# Patient Record
Sex: Female | Born: 1965 | Race: White | Hispanic: No | Marital: Married | Smoking: Current every day smoker
Health system: Southern US, Community
[De-identification: ages and names within clinical notes are randomized; demographics above are authoritative.]

## PROBLEM LIST (undated history)

## (undated) DIAGNOSIS — R87629 Unspecified abnormal cytological findings in specimens from vagina: Secondary | ICD-10-CM

## (undated) HISTORY — PX: OOPHORECTOMY: SHX86

## (undated) HISTORY — DX: Unspecified abnormal cytological findings in specimens from vagina: R87.629

## (undated) HISTORY — PX: BREAST SURGERY: SHX581

## (undated) HISTORY — PX: TUBAL LIGATION: SHX77

---

## 2015-06-28 LAB — HEPATIC FUNCTION PANEL
ALK PHOS: 49 U/L (ref 25–125)
ALT: 18 U/L (ref 7–35)
AST: 10 U/L — AB (ref 13–35)
BILIRUBIN, TOTAL: 0.6 mg/dL

## 2015-06-28 LAB — CBC AND DIFFERENTIAL
HEMATOCRIT: 43 % (ref 36–46)
HEMOGLOBIN: 14.7 g/dL (ref 12.0–16.0)
Neutrophils Absolute: 4 /uL
Platelets: 241 10*3/uL (ref 150–399)
WBC: 6.1 10^3/mL

## 2015-07-23 LAB — HM PAP SMEAR: HM PAP: NEGATIVE

## 2016-07-31 ENCOUNTER — Other Ambulatory Visit (HOSPITAL_COMMUNITY): Payer: Self-pay | Admitting: Obstetrics & Gynecology

## 2016-07-31 ENCOUNTER — Other Ambulatory Visit: Payer: Self-pay | Admitting: Obstetrics & Gynecology

## 2016-07-31 DIAGNOSIS — Z1239 Encounter for other screening for malignant neoplasm of breast: Secondary | ICD-10-CM

## 2016-08-14 ENCOUNTER — Ambulatory Visit (INDEPENDENT_AMBULATORY_CARE_PROVIDER_SITE_OTHER): Payer: BLUE CROSS/BLUE SHIELD | Admitting: Obstetrics & Gynecology

## 2016-08-14 ENCOUNTER — Encounter: Payer: Self-pay | Admitting: Obstetrics & Gynecology

## 2016-08-14 ENCOUNTER — Encounter: Payer: Self-pay | Admitting: Physician Assistant

## 2016-08-14 ENCOUNTER — Ambulatory Visit (INDEPENDENT_AMBULATORY_CARE_PROVIDER_SITE_OTHER): Payer: BLUE CROSS/BLUE SHIELD | Admitting: Physician Assistant

## 2016-08-14 VITALS — BP 119/82 | HR 96 | Ht 66.0 in | Wt 158.0 lb

## 2016-08-14 VITALS — BP 113/83 | HR 90 | Ht 66.0 in | Wt 157.0 lb

## 2016-08-14 DIAGNOSIS — R002 Palpitations: Secondary | ICD-10-CM | POA: Insufficient documentation

## 2016-08-14 DIAGNOSIS — Z01419 Encounter for gynecological examination (general) (routine) without abnormal findings: Secondary | ICD-10-CM | POA: Diagnosis not present

## 2016-08-14 DIAGNOSIS — Z13220 Encounter for screening for lipoid disorders: Secondary | ICD-10-CM

## 2016-08-14 DIAGNOSIS — Z1151 Encounter for screening for human papillomavirus (HPV): Secondary | ICD-10-CM | POA: Diagnosis not present

## 2016-08-14 DIAGNOSIS — Z124 Encounter for screening for malignant neoplasm of cervix: Secondary | ICD-10-CM

## 2016-08-14 DIAGNOSIS — M549 Dorsalgia, unspecified: Secondary | ICD-10-CM | POA: Diagnosis not present

## 2016-08-14 DIAGNOSIS — R635 Abnormal weight gain: Secondary | ICD-10-CM | POA: Diagnosis not present

## 2016-08-14 DIAGNOSIS — F4323 Adjustment disorder with mixed anxiety and depressed mood: Secondary | ICD-10-CM | POA: Insufficient documentation

## 2016-08-14 DIAGNOSIS — D171 Benign lipomatous neoplasm of skin and subcutaneous tissue of trunk: Secondary | ICD-10-CM | POA: Insufficient documentation

## 2016-08-14 DIAGNOSIS — Z720 Tobacco use: Secondary | ICD-10-CM

## 2016-08-14 DIAGNOSIS — K2100 Gastro-esophageal reflux disease with esophagitis, without bleeding: Secondary | ICD-10-CM | POA: Insufficient documentation

## 2016-08-14 DIAGNOSIS — Z0000 Encounter for general adult medical examination without abnormal findings: Secondary | ICD-10-CM | POA: Diagnosis not present

## 2016-08-14 DIAGNOSIS — Z131 Encounter for screening for diabetes mellitus: Secondary | ICD-10-CM | POA: Diagnosis not present

## 2016-08-14 DIAGNOSIS — N950 Postmenopausal bleeding: Secondary | ICD-10-CM

## 2016-08-14 DIAGNOSIS — L908 Other atrophic disorders of skin: Secondary | ICD-10-CM | POA: Insufficient documentation

## 2016-08-14 DIAGNOSIS — R1310 Dysphagia, unspecified: Secondary | ICD-10-CM | POA: Diagnosis not present

## 2016-08-14 DIAGNOSIS — Z716 Tobacco abuse counseling: Secondary | ICD-10-CM | POA: Diagnosis not present

## 2016-08-14 DIAGNOSIS — R413 Other amnesia: Secondary | ICD-10-CM

## 2016-08-14 LAB — CBC WITH DIFFERENTIAL/PLATELET
BASOS ABS: 63 {cells}/uL (ref 0–200)
BASOS PCT: 1 %
EOS ABS: 126 {cells}/uL (ref 15–500)
Eosinophils Relative: 2 %
HEMATOCRIT: 46.4 % — AB (ref 35.0–45.0)
HEMOGLOBIN: 15.3 g/dL (ref 11.7–15.5)
LYMPHS ABS: 1386 {cells}/uL (ref 850–3900)
Lymphocytes Relative: 22 %
MCH: 29.4 pg (ref 27.0–33.0)
MCHC: 33 g/dL (ref 32.0–36.0)
MCV: 89.1 fL (ref 80.0–100.0)
MONO ABS: 567 {cells}/uL (ref 200–950)
MPV: 10.2 fL (ref 7.5–12.5)
Monocytes Relative: 9 %
NEUTROS ABS: 4158 {cells}/uL (ref 1500–7800)
Neutrophils Relative %: 66 %
Platelets: 246 10*3/uL (ref 140–400)
RBC: 5.21 MIL/uL — ABNORMAL HIGH (ref 3.80–5.10)
RDW: 13.3 % (ref 11.0–15.0)
WBC: 6.3 10*3/uL (ref 3.8–10.8)

## 2016-08-14 LAB — TSH: TSH: 0.85 mIU/L

## 2016-08-14 MED ORDER — SERTRALINE HCL 25 MG PO TABS
25.0000 mg | ORAL_TABLET | Freq: Every day | ORAL | 2 refills | Status: AC
Start: 2016-08-14 — End: ?

## 2016-08-14 MED ORDER — ESTRADIOL 1 MG PO TABS: 1.0000 mg | ORAL_TABLET | Freq: Every day | ORAL | 12 refills | Status: DC

## 2016-08-14 MED ORDER — MEDROXYPROGESTERONE ACETATE 2.5 MG PO TABS: 2.5000 mg | ORAL_TABLET | Freq: Every day | ORAL | 12 refills | Status: DC

## 2016-08-14 MED ORDER — VARENICLINE TARTRATE 0.5 MG X 11 & 1 MG X 42 PO MISC: ORAL | 0 refills | Status: AC

## 2016-08-14 MED ORDER — OMEPRAZOLE 40 MG PO CPDR: 40.0000 mg | DELAYED_RELEASE_CAPSULE | Freq: Every day | ORAL | 1 refills | Status: AC

## 2016-08-14 MED ORDER — ALPRAZOLAM 0.5 MG PO TABS: 0.5000 mg | ORAL_TABLET | Freq: Every evening | ORAL | 0 refills | Status: AC | PRN

## 2016-08-14 NOTE — Progress Notes (Signed)
Subjective:    Leslie Tran is a 51 y.o. MW P3 (29, 44, and 35 yo kids, no grands yet) female who presents for an annual exam. The patient has no complaints today. She has had some light spotting several times in the few months. She is taking estrace 1 mg daily along with micronor The patient is sexually active. GYN screening history: last pap: was normal. The patient wears seatbelts: yes. The patient participates in regular exercise: yes. Has the patient ever been transfused or tattooed?: no. The patient reports that there is not domestic violence in her life.   Menstrual History: OB History    Gravida Para Term Preterm AB Living   3 3 2 1   3    SAB TAB Ectopic Multiple Live Births                  Menarche age: 62 No LMP recorded. Patient is postmenopausal.    The following portions of the patient's history were reviewed and updated as appropriate: allergies, current medications, past family history, past medical history, past social history, past surgical history and problem list.  Review of Systems Pertinent items are noted in HPI.   Breast fed her kids FH- no breast, gyn, colon cancer Colonoscopy at 51 yo, good for 10 years Married for 23 years, denies dysapareunia Mammo due, scheduled for tomorrow   Objective:    BP 113/83   Pulse 90   Ht 5\' 6"  (1.676 m)   Wt 157 lb (71.2 kg)   BMI 25.34 kg/m   General Appearance:    Alert, cooperative, no distress, appears stated age  Head:    Normocephalic, without obvious abnormality, atraumatic  Eyes:    PERRL, conjunctiva/corneas clear, EOM's intact, fundi    benign, both eyes  Ears:    Normal TM's and external ear canals, both ears  Nose:   Nares normal, septum midline, mucosa normal, no drainage    or sinus tenderness  Throat:   Lips, mucosa, and tongue normal; teeth and gums normal  Neck:   Supple, symmetrical, trachea midline, no adenopathy;    thyroid:  no enlargement/tenderness/nodules; no carotid   bruit or JVD  Back:      Symmetric, no curvature, ROM normal, no CVA tenderness  Lungs:     Clear to auscultation bilaterally, respirations unlabored  Chest Wall:    No tenderness or deformity   Heart:    Regular rate and rhythm, S1 and S2 normal, no murmur, rub   or gallop  Breast Exam:    No tenderness, masses, or nipple abnormality  Abdomen:     Soft, non-tender, bowel sounds active all four quadrants,    no masses, no organomegaly  Genitalia:    Normal female without lesion, discharge or tenderness, NSSA, NT, mobile, no adnexal masses     Extremities:   Extremities normal, atraumatic, no cyanosis or edema  Pulses:   2+ and symmetric all extremities  Skin:   Skin color, texture, turgor normal, no rashes or lesions  Lymph nodes:   Cervical, supraclavicular, and axillary nodes normal  Neurologic:   CNII-XII intact, normal strength, sensation and reflexes    throughout  .    Assessment:    Healthy female exam.    Plan:     Thin prep Pap smear. with cotesting PMB with inadequate progestin coverage- schedule gyn u/s D/c micronor and add provera 2.5 mg daily

## 2016-08-14 NOTE — Progress Notes (Signed)
Subjective:    Patient ID: Leslie Tran, female    DOB: 1966-02-10, 51 y.o.   MRN: [id number]  HPI  Pt is a post-menopausal 51 yo female who presents to the clinic to establish care.   .. Active Ambulatory Problems    Diagnosis Date Noted  . Weight gain 08/14/2016  . Palpitations 08/14/2016  . Problems with swallowing 08/14/2016  . Mid back pain on right side 08/14/2016  . Gastroesophageal reflux disease with esophagitis 08/14/2016  . Encounter for smoking cessation counseling 08/14/2016  . Adjustment disorder with mixed anxiety and depressed mood 08/14/2016  . Skin aging 08/14/2016  . Memory changes 08/14/2016   Resolved Ambulatory Problems    Diagnosis Date Noted  . No Resolved Ambulatory Problems   Past Medical History:  Diagnosis Date  . Vaginal Pap smear, abnormal    .. Family History  Problem Relation Age of Onset  . Hyperlipidemia Mother   . Hypertension Mother   . Hyperlipidemia Father   . Hypertension Father   . Cancer Sister        brain   .Marland Kitchen Social History   Social History  . Marital status: Married    Spouse name: N/A  . Number of children: N/A  . Years of education: N/A   Occupational History  . Not on file.   Social History Main Topics  . Smoking status: Current Every Day Smoker  . Smokeless tobacco: Never Used  . Alcohol use Yes  . Drug use: No  . Sexual activity: Yes    Birth control/ protection: Post-menopausal   Other Topics Concern  . Not on file   Social History Narrative  . No narrative on file   She is taking estradiol and Micronor daily for HRT after bilateral oophorectomy.   She has multiple concerns today. She is due for her 1 year physical exam.   On may 1st her husband was let go from a job he moved here for in November. She is very upset about this and feels like she is going to "fall apart". He is actively looking for a job. Her sister has brain cancer and just left her husband. She feels like she has noone to talk  to. She also has health concerns that are making her feel like something is wrong with her.   Recently she has started having palpitations mostly at night. She denies any CP. She feels a little SOB.   She is having some problems swallowing. She feels like she cannot get a full swallow. Denies any choking. She has no problems eating. She does notice her heart burn has been worse lately and taking daily zantac which helps some.   Her memory has not been good lately. She forgets details of homes she looked at and things she is supposed to do. She has not forgotten names or people.   She has a lipoma of right lower back for years but not had removed because not causing any pain now she is having more pain and radiating up into right mid back and into right upper quadrant. She denies and nausea or vomiting.       Review of Systems See HPI.     Objective:   Physical Exam  Constitutional: She is oriented to person, place, and time. She appears well-developed and well-nourished.  HENT:  Head: Normocephalic and atraumatic.  Right Ear: External ear normal.  Left Ear: External ear normal.  Nose: Nose normal.  Mouth/Throat: Oropharynx is clear and  moist. No oropharyngeal exudate.  TM's clear bilaterally.   Eyes: Conjunctivae are normal.  Neck: Normal range of motion. Neck supple. No thyromegaly present.  Cardiovascular: Normal rate, regular rhythm and normal heart sounds.   Pulmonary/Chest: Effort normal and breath sounds normal. She has no wheezes.  Abdominal: Soft. Bowel sounds are normal. She exhibits no distension and no mass. There is no tenderness. There is no rebound and no guarding.  Musculoskeletal:  Right lower back palpable lipoma tender to palpation.   Lymphadenopathy:    She has no cervical adenopathy.  Neurological: She is alert and oriented to person, place, and time.  Psychiatric: She has a normal mood and affect. Her behavior is normal.          Assessment & Plan:   Marland KitchenMarland KitchenValley was seen today for establish care and palpitations.  Diagnoses and all orders for this visit:  Routine physical examination  Palpitations -     EKG 12-Lead -     CBC with Differential/Platelet -     Vitamin D 1,25 dihydroxy -     B12  Weight gain -     CBC with Differential/Platelet -     Vitamin D 1,25 dihydroxy -     B12  Problems with swallowing -     TSH -     omeprazole (PRILOSEC) 40 MG capsule; Take 1 capsule (40 mg total) by mouth daily. (Patient not taking: Reported on 08/14/2016) -     US SOFT TISSUE HEAD AND NECK; Future  Mid back pain on right side -     US Abdomen Complete; Future  Gastroesophageal reflux disease with esophagitis -     omeprazole (PRILOSEC) 40 MG capsule; Take 1 capsule (40 mg total) by mouth daily. (Patient not taking: Reported on 08/14/2016)  Adjustment disorder with mixed anxiety and depressed mood -     TSH -     sertraline (ZOLOFT) 25 MG tablet; Take 1 tablet (25 mg total) by mouth daily. (Patient not taking: Reported on 08/14/2016) -     ALPRAZolam (XANAX) 0.5 MG tablet; Take 1 tablet (0.5 mg total) by mouth at bedtime as needed for anxiety. (Patient not taking: Reported on 08/14/2016)  Screening for lipid disorders -     Lipid panel  Screening for diabetes mellitus -     COMPLETE METABOLIC PANEL WITH GFR  Skin aging -     Ambulatory referral to Dermatology  Encounter for smoking cessation counseling -     varenicline (CHANTIX STARTING MONTH PAK) 0.5 MG X 11 & 1 MG X 42 tablet; Take one 0.5mg  tablet by mouth once daily for 3 days, then increase to one 0.5mg  tablet twice daily for 3 days, then increase to one 1mg  tablet twice daily. (Patient not taking: Reported on 08/14/2016)  Memory changes   Mammogram scheduled.  Colonoscopy up to date.  Pap smear scheduled.  Vaccines up to date.  Declined HIV and Hep C.   Screening labs ordered.   EKG- NSR, no arrhthymias, no ST elevation or depression. Reassurance given that  likely palpitations are PVC's and stress could be trigger. Consider holter monitor if do not resolve.   .. Depression screen [facility] 2/9 08/14/2016  Decreased Interest 2  Down, Depressed, Hopeless 2  PHQ - 2 Score 4  Altered sleeping 1  Tired, decreased energy 2  Change in appetite 1  Feeling bad or failure about yourself  1  Trouble concentrating 3  Moving slowly or fidgety/restless 1  Suicidal thoughts  0  PHQ-9 Score 13   I certainly feel like depression and anxiety might be driving most of these symptoms. I would like to start zoloft and as needed xanax. Discussed side effects. Follow up in 4-6 weeks. Encouraged counseling but money is an issue right now. Will consider MMSE if memory changes persist order b12 to fully evaluate   Due to problems swallowing will start omeprazole daily and get neck u/s. Reassured I did not feel any thyroid enlargement or enlarged lymph nodes. If not resolving will send to GI and/or ENT.   Request dermatology referral for yearly skin check.   Will continue to follow lipoma. Consider general surgery referral to consider removal.   Pt is having some pain around kidneys that radiates to the right upper quadrant. Will get abdominal u/s.

## 2016-08-15 ENCOUNTER — Ambulatory Visit (INDEPENDENT_AMBULATORY_CARE_PROVIDER_SITE_OTHER): Payer: BLUE CROSS/BLUE SHIELD

## 2016-08-15 ENCOUNTER — Telehealth: Payer: Self-pay

## 2016-08-15 ENCOUNTER — Encounter: Payer: Self-pay | Admitting: Physician Assistant

## 2016-08-15 DIAGNOSIS — E010 Iodine-deficiency related diffuse (endemic) goiter: Secondary | ICD-10-CM | POA: Diagnosis not present

## 2016-08-15 DIAGNOSIS — M549 Dorsalgia, unspecified: Secondary | ICD-10-CM

## 2016-08-15 DIAGNOSIS — Z1231 Encounter for screening mammogram for malignant neoplasm of breast: Secondary | ICD-10-CM | POA: Diagnosis not present

## 2016-08-15 DIAGNOSIS — N950 Postmenopausal bleeding: Secondary | ICD-10-CM | POA: Diagnosis not present

## 2016-08-15 DIAGNOSIS — K8020 Calculus of gallbladder without cholecystitis without obstruction: Secondary | ICD-10-CM

## 2016-08-15 DIAGNOSIS — Z1239 Encounter for other screening for malignant neoplasm of breast: Secondary | ICD-10-CM

## 2016-08-15 DIAGNOSIS — R1310 Dysphagia, unspecified: Secondary | ICD-10-CM

## 2016-08-15 DIAGNOSIS — E042 Nontoxic multinodular goiter: Secondary | ICD-10-CM | POA: Insufficient documentation

## 2016-08-15 LAB — COMPLETE METABOLIC PANEL WITH GFR
ALT: 22 U/L (ref 6–29)
AST: 19 U/L (ref 10–35)
Albumin: 4.6 g/dL (ref 3.6–5.1)
Alkaline Phosphatase: 40 U/L (ref 33–130)
BUN: 11 mg/dL (ref 7–25)
CHLORIDE: 105 mmol/L (ref 98–110)
CO2: 21 mmol/L (ref 20–31)
Calcium: 9.4 mg/dL (ref 8.6–10.4)
Creat: 0.81 mg/dL (ref 0.50–1.05)
GFR, EST NON AFRICAN AMERICAN: 84 mL/min (ref >=60)
GFR, Est African American: >89 mL/min (ref >=60)
GLUCOSE: 96 mg/dL (ref 65–99)
POTASSIUM: 4 mmol/L (ref 3.5–5.3)
SODIUM: 141 mmol/L (ref 135–146)
Total Bilirubin: 0.5 mg/dL (ref 0.2–1.2)
Total Protein: 7.3 g/dL (ref 6.1–8.1)

## 2016-08-15 LAB — LIPID PANEL
CHOL/HDL RATIO: 3.1 ratio (ref <5.0)
Cholesterol: 200 mg/dL — ABNORMAL HIGH (ref <200)
HDL: 65 mg/dL (ref >50)
LDL CALC: 116 mg/dL — AB (ref <100)
Triglycerides: 96 mg/dL (ref <150)
VLDL: 19 mg/dL (ref <30)

## 2016-08-15 LAB — VITAMIN B12: Vitamin B-12: 458 pg/mL (ref 200–1100)

## 2016-08-15 MED ORDER — MISOPROSTOL 200 MCG PO TABS: 200.0000 ug | ORAL_TABLET | Freq: Four times a day (QID) | ORAL | 0 refills | Status: DC

## 2016-08-15 NOTE — Telephone Encounter (Signed)
Spoke with pt and explained to her that Dr.Dove would like her to come in for an endometrial biopsy to be done. Appt was scheduled. I also explained to the pt that I sent pills to her pharmacy that need to be taken by mouth the night before the procedure. Pt expressed understanding.

## 2016-08-15 NOTE — Progress Notes (Signed)
Call pt: you have gallstones in gallbladder which could cause some right upper quadrant pain at times. At this time no signs of acute gallbladder inflammation. I would suggest gallbladder to be removed and see a surgeon; however, I know it is a stressfull time. we could not wait until a better time or becomes more symptomatic.

## 2016-08-16 ENCOUNTER — Encounter: Payer: Self-pay | Admitting: Obstetrics & Gynecology

## 2016-08-16 LAB — VITAMIN D 1,25 DIHYDROXY
VITAMIN D3 1, 25 (OH): 54 pg/mL
Vitamin D 1, 25 (OH)2 Total: 54 pg/mL (ref 18–72)

## 2016-08-16 NOTE — Progress Notes (Signed)
Call pt: vitamin d level is great!

## 2016-08-17 LAB — CYTOLOGY - PAP
DIAGNOSIS: NEGATIVE
HPV: NOT DETECTED

## 2016-08-21 ENCOUNTER — Ambulatory Visit (INDEPENDENT_AMBULATORY_CARE_PROVIDER_SITE_OTHER): Payer: BLUE CROSS/BLUE SHIELD | Admitting: Obstetrics & Gynecology

## 2016-08-21 ENCOUNTER — Encounter: Payer: Self-pay | Admitting: Obstetrics & Gynecology

## 2016-08-21 ENCOUNTER — Encounter: Payer: Self-pay | Admitting: *Deleted

## 2016-08-21 VITALS — BP 129/85 | HR 104 | Resp 16 | Ht 66.0 in | Wt 157.0 lb

## 2016-08-21 DIAGNOSIS — N950 Postmenopausal bleeding: Secondary | ICD-10-CM | POA: Diagnosis not present

## 2016-08-21 MED ORDER — ESTRADIOL-LEVONORGESTREL 0.045-0.015 MG/DAY TD PTWK: 1.0000 | MEDICATED_PATCH | TRANSDERMAL | 12 refills | Status: AC

## 2016-08-24 NOTE — Progress Notes (Signed)
   Subjective:    Patient ID: Leslie Tran, female    DOB: 05/05/1965, 51 y.o.   MRN: [id number]  HPI 51 y/o MW lady here for [facility].   Review of Systems     Objective:   Physical Exam   UPT negative, consent signed, time out done Cervix prepped with betadine and grasped with a single tooth tenaculum Uterus sounded to 9 cm Pipelle used for 2 passes with a moderate amount of tissue obtained. She tolerated the procedure well.        Assessment & Plan:  PMB- await pathology

## 2016-08-25 ENCOUNTER — Encounter: Payer: Self-pay | Admitting: Physician Assistant

## 2017-05-07 ENCOUNTER — Telehealth: Payer: Self-pay | Admitting: *Deleted

## 2017-05-07 NOTE — Telephone Encounter (Signed)
Pt left vm wanting to know if you would send in the next 2 months of Chantix for her.  You prescribed the starter pack in May of 2018 but pt has just started it about 3 weeks ago. Pt has since moved to [facility] and would like you to send it to [facility] on [address] if you agree to send it.

## 2017-05-07 NOTE — Telephone Encounter (Signed)
The goal was to follow up one month to confirm you tolerated medication and stopped smoking one month into therapy to then give the other 2 rx's. You need to follow up with a PCP in the area.   Please take my name off as PCP.

## 2017-08-21 ENCOUNTER — Other Ambulatory Visit: Payer: Self-pay | Admitting: Obstetrics & Gynecology

## 2017-12-01 IMAGING — US US SOFT TISSUE HEAD/NECK
1 series · 13 of 25 positions shown · non-contrast
Comparison: None.

CLINICAL DATA: Difficulty swallowing x4 months.

EXAM:
THYROID ULTRASOUND
TECHNIQUE: Ultrasound examination of the thyroid gland and adjacent soft
tissues was performed.

[Series 1: us soft tissue head/neck · 0.05mm/px · 13 of 60 slices shown]
[im 1/60]
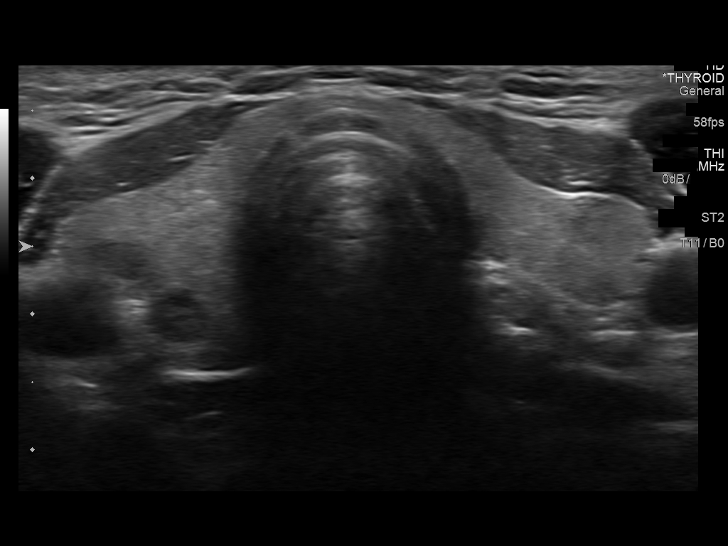
[im 5/60]
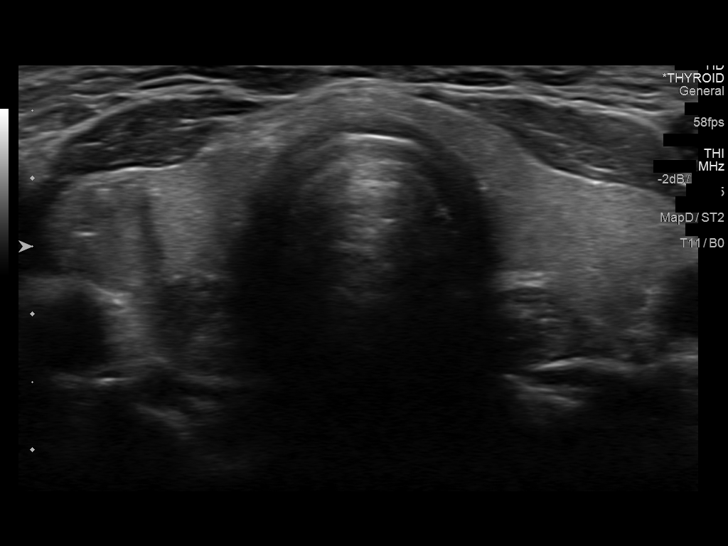
[im 10/60]
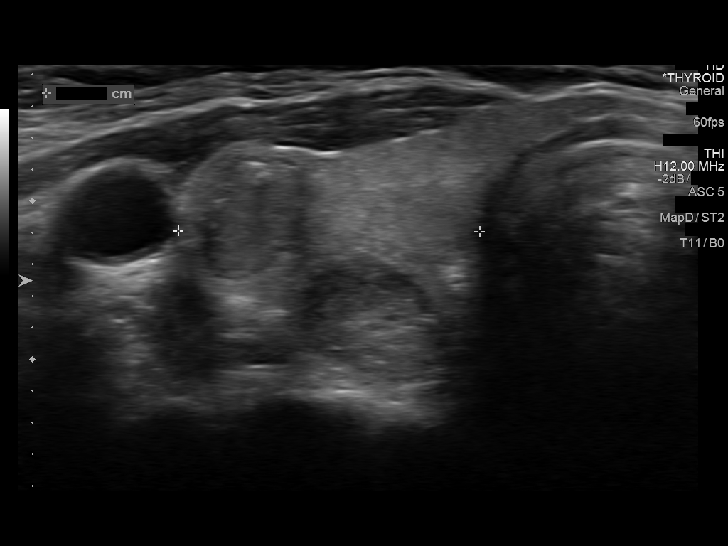
[im 15/60]
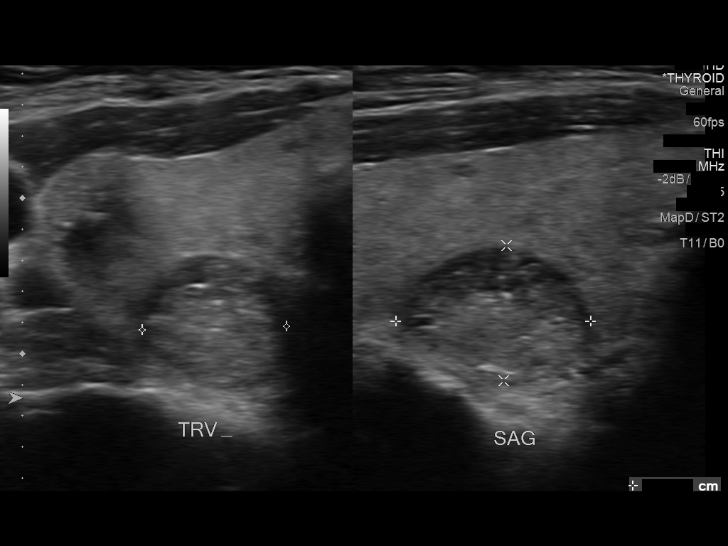
[im 20/60]
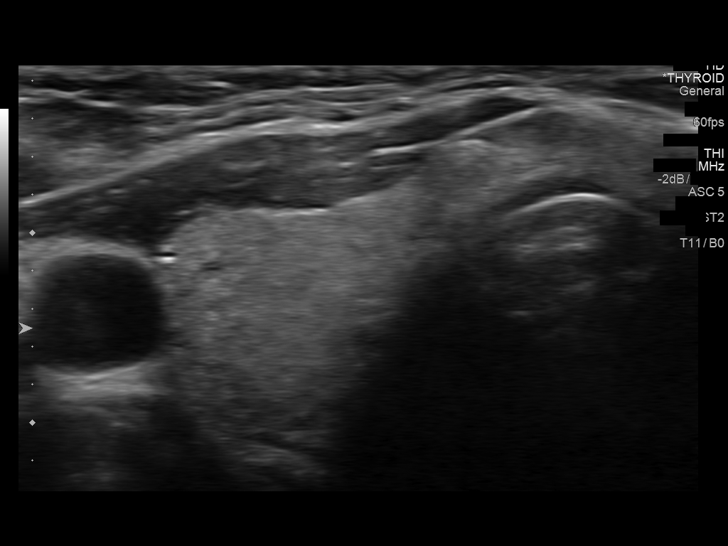
[im 25/60]
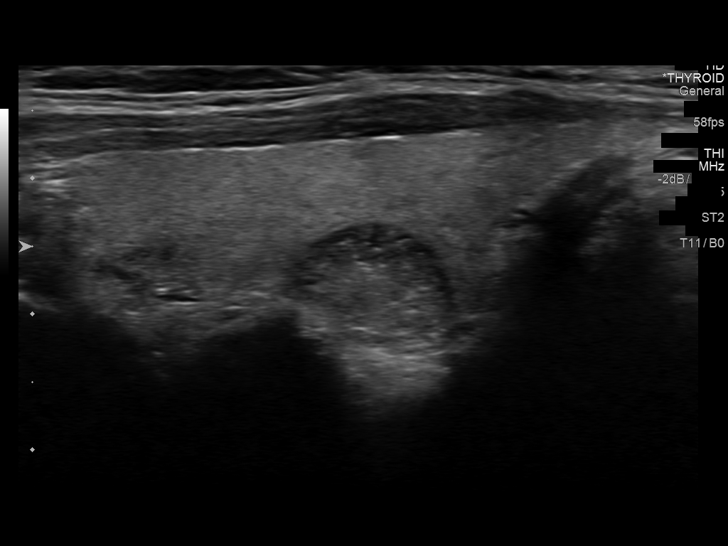
[im 30/60]
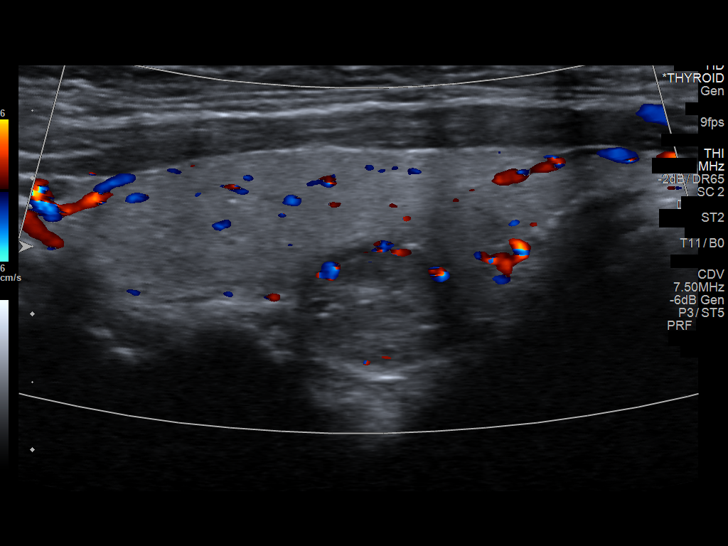
[im 35/60]
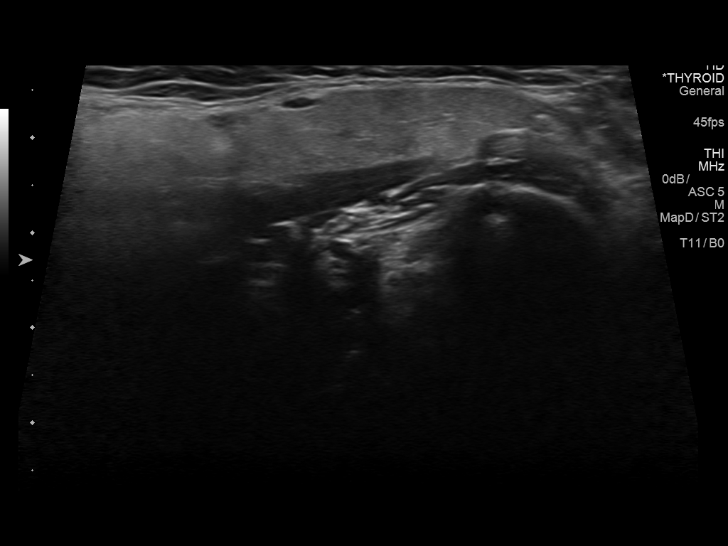
[im 40/60]
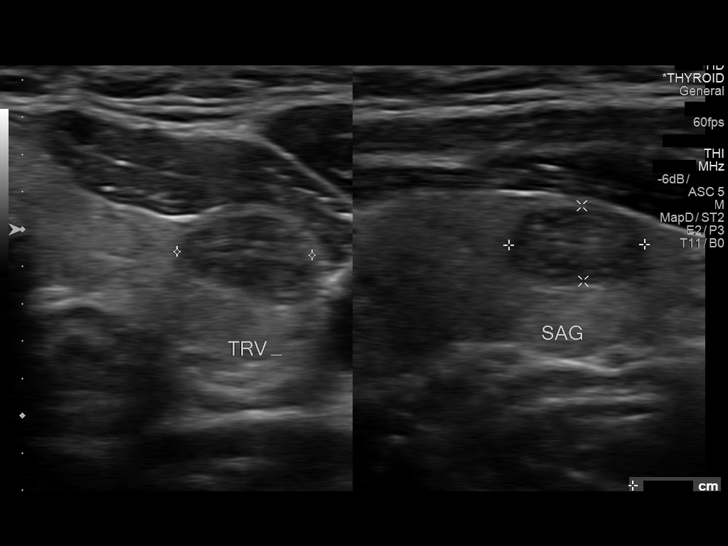
[im 45/60]
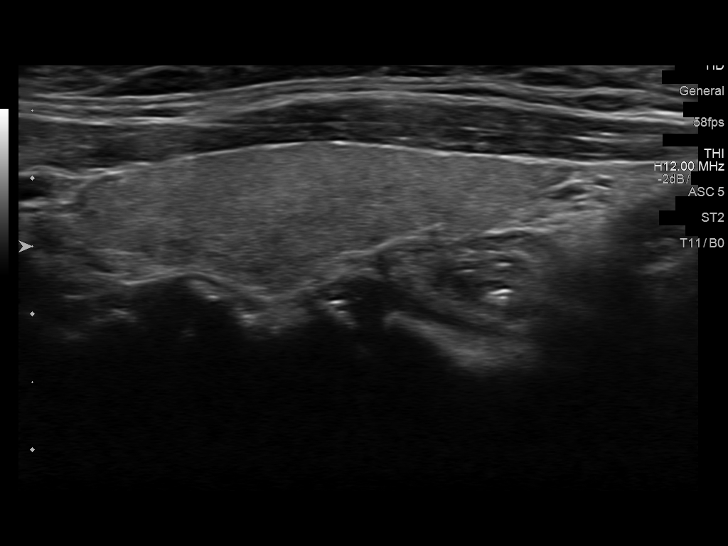
[im 50/60]
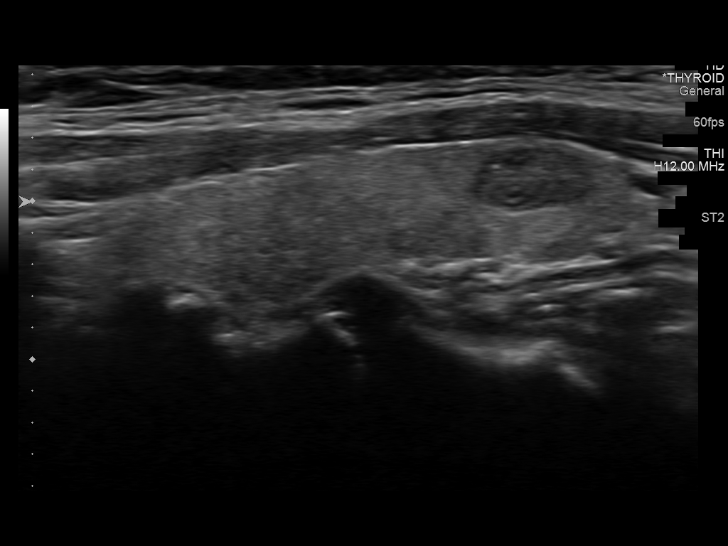
[im 55/60]
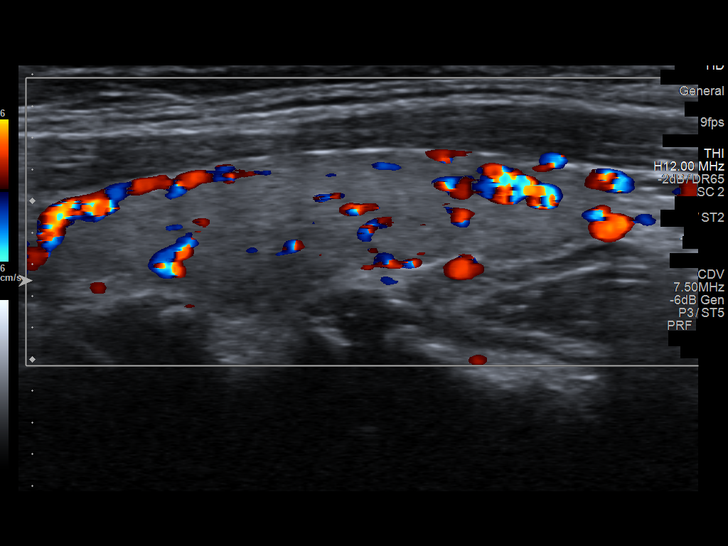
[im 60/60]
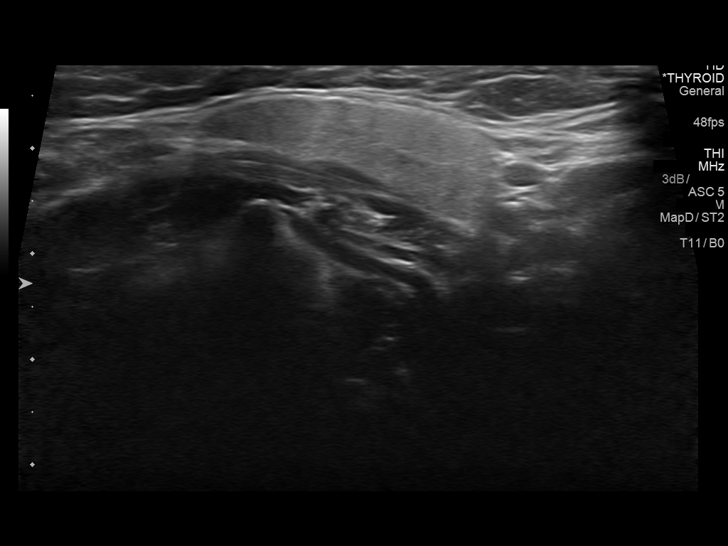

[13 of 25 positions shown; findings below may reference images not displayed]

FINDINGS: Parenchymal Echotexture: Mildly heterogenous

Isthmus: 0.2 cm thickness

Right lobe: 5.7 x 1.8 x 1.9 cm

Left lobe: 4.3 x 1.1 x 1.6 cm

_________________________________________________________

Estimated total number of nodules >/= 1 cm: 1

Number of spongiform nodules >/=  2 cm not described below (TR1): 0

Number of mixed cystic and solid nodules >/= 1.5 cm not described
below (TR2): 0

_________________________________________________________

Nodule # 1:

Location: Right; Mid

Maximum size: 1.3 cm; Other 2 dimensions: 0.9 x 0.9 cm

Composition: solid/almost completely solid (2)

Echogenicity: hypoechoic (2)

Shape: not taller-than-wide (0)

Margins: smooth (0)

Echogenic foci: macrocalcifications (1)

ACR TI-RADS total points: 5.

ACR TI-RADS risk category: TR4 (4-6 points).

ACR TI-RADS recommendations:

*Given size (>/= 1 - 1.4 cm) and appearance, a follow-up ultrasound
in 1 year should be considered based on TI-RADS criteria.And a

_________________________________________________________

Nodule # 2:

Location: Right; Superior

Maximum size: 0.8 cm; Other 2 dimensions: 0.7 x 0.7 cm

Composition: solid/almost completely solid (2)

Echogenicity: hypoechoic (2)

Shape: taller-than-wide (3)

Margins: ill-defined (0)

Echogenic foci: peripheral calcifications (2)

ACR TI-RADS total points: 9.

ACR TI-RADS risk category: TR5 (>/= 7 points).

ACR TI-RADS recommendations:

*Given size (>/= 0.5 - 0.9 cm) and appearance, a follow-up
ultrasound in 1 year should be considered based on TI-RADS criteria.

_________________________________________________________

Nodule # 3:

Location: Left; Inferior

Maximum size: 0.7 cm; Other 2 dimensions: 0.7 x 0.4 cm

Composition: solid/almost completely solid (2)

Echogenicity: hypoechoic (2)

Shape: not taller-than-wide (0)

Margins: smooth (0)

Echogenic foci: none (0)

ACR TI-RADS total points: 4.

ACR TI-RADS risk category: TR4 (4-6 points).

ACR TI-RADS recommendations:

Given size (<0.9 cm) and appearance, this nodule does NOT meet
TI-RADS criteria for biopsy or dedicated follow-up.

_________________________________________________________
IMPRESSION: 1. Thyromegaly with bilateral nodules. None meets criteria for
biopsy.
2. Recommend 1 year follow-up surveillance ultrasound of 2 right
lesions as above.

The above is in keeping with the ACR TI-RADS recommendations - [HOSPITAL] 9045;[DATE].

## 2017-12-01 IMAGING — US US ABDOMEN COMPLETE
1 series · 13 of 25 positions shown · non-contrast
Comparison: None.

CLINICAL DATA: Right-sided abdominal pain

EXAM:
ABDOMEN ULTRASOUND COMPLETE

[Series 1: us abdomen complete · 0.15mm/px · 13 of 80 slices shown]
[im 1/80]
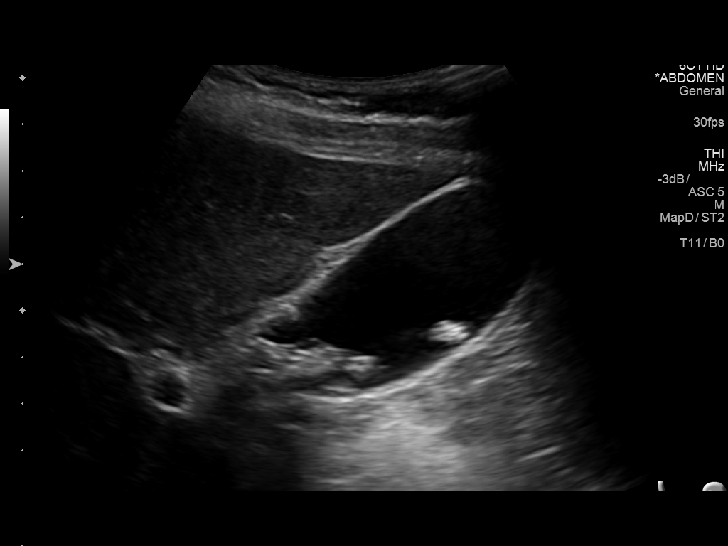
[im 7/80]
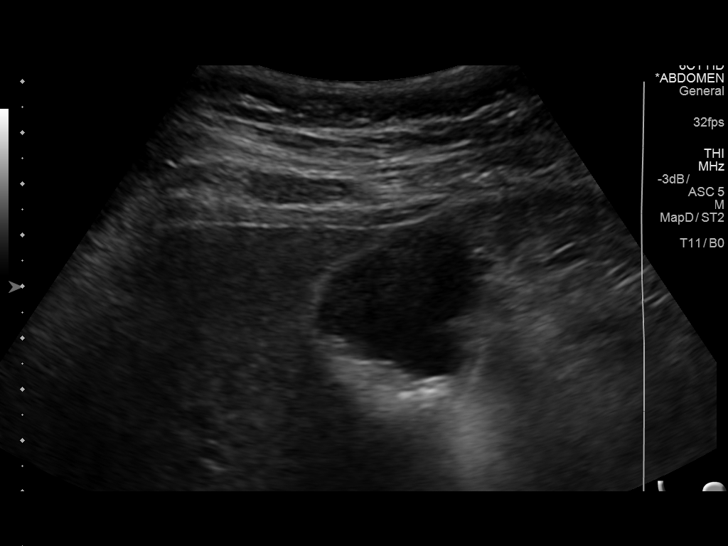
[im 14/80]
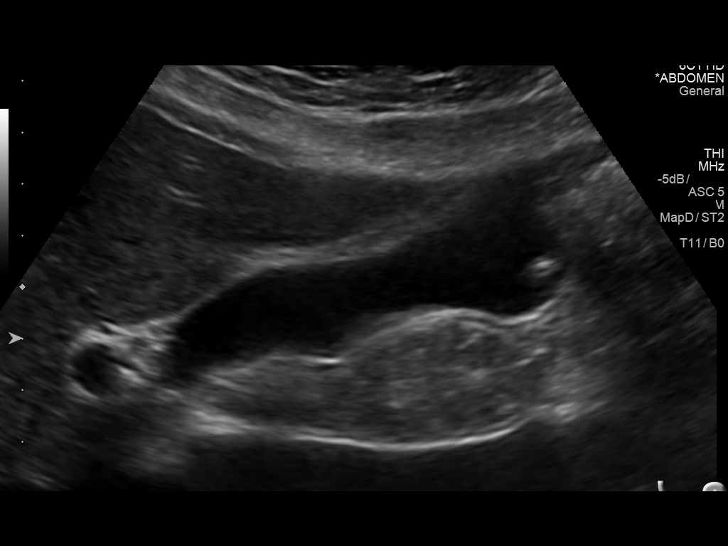
[im 20/80]
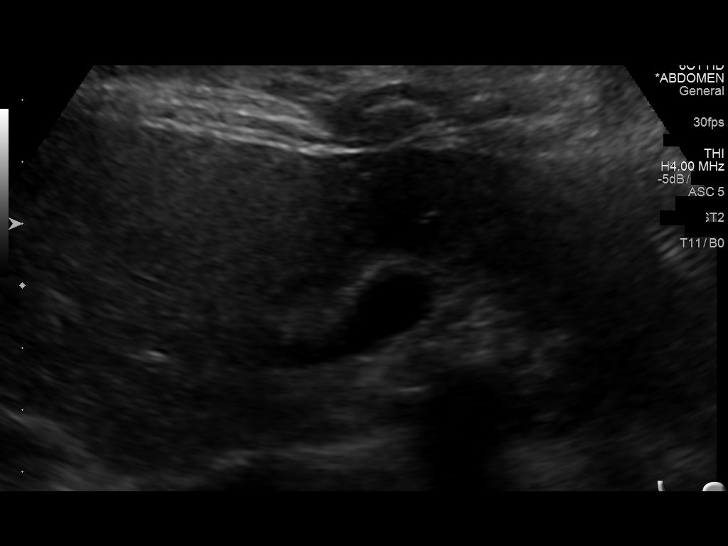
[im 27/80]
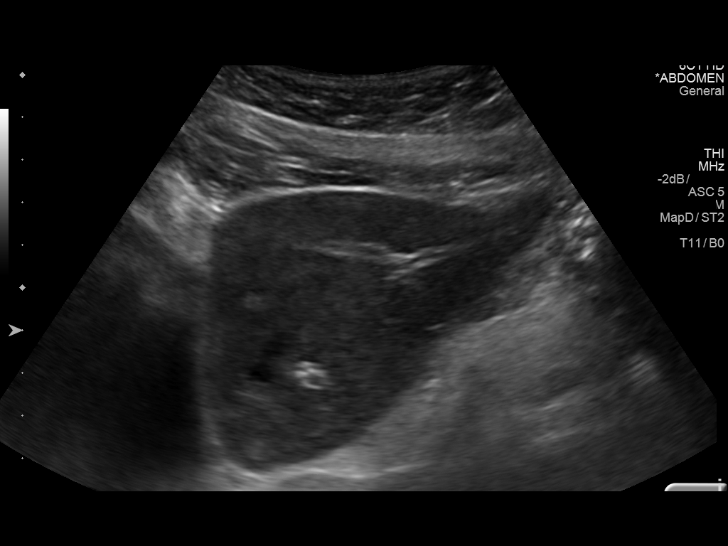
[im 33/80]
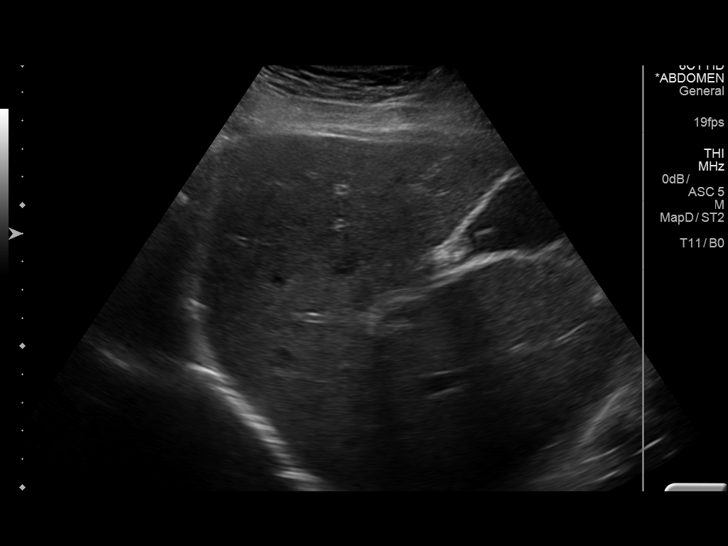
[im 40/80]
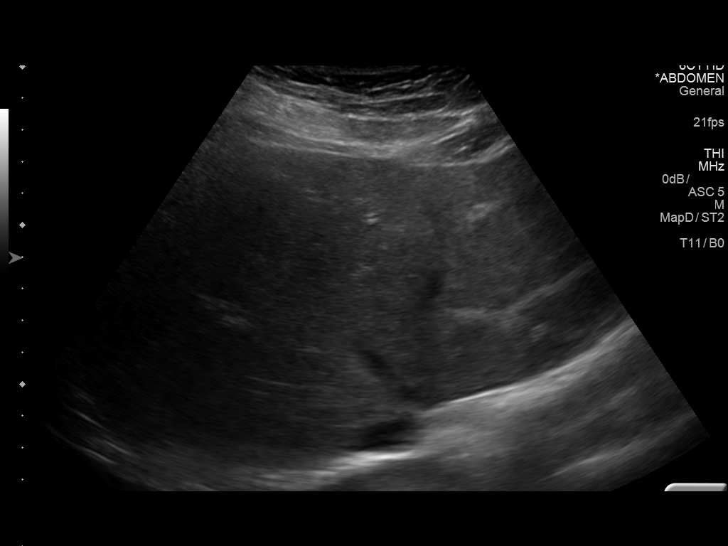
[im 47/80]
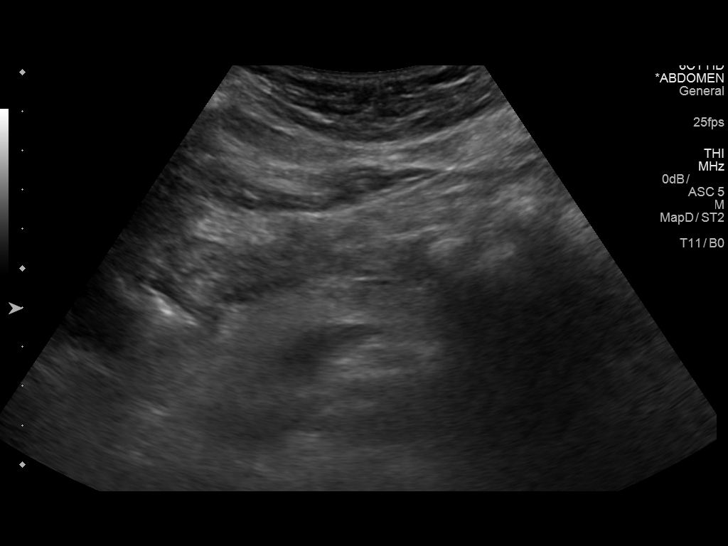
[im 53/80]
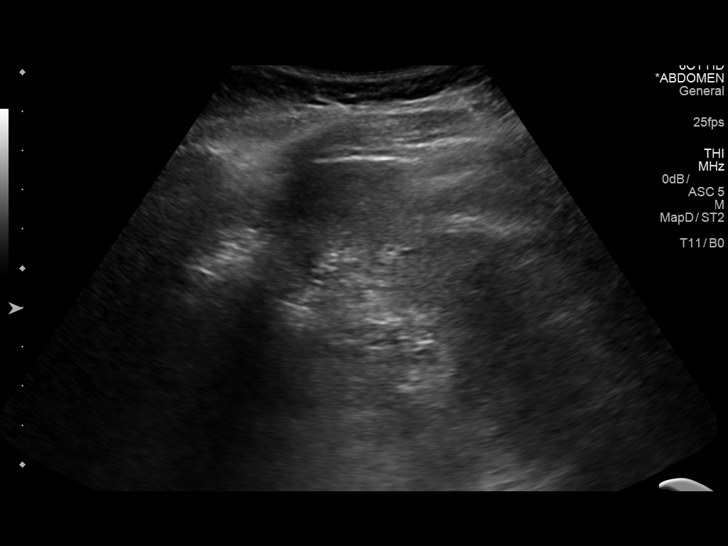
[im 60/80]
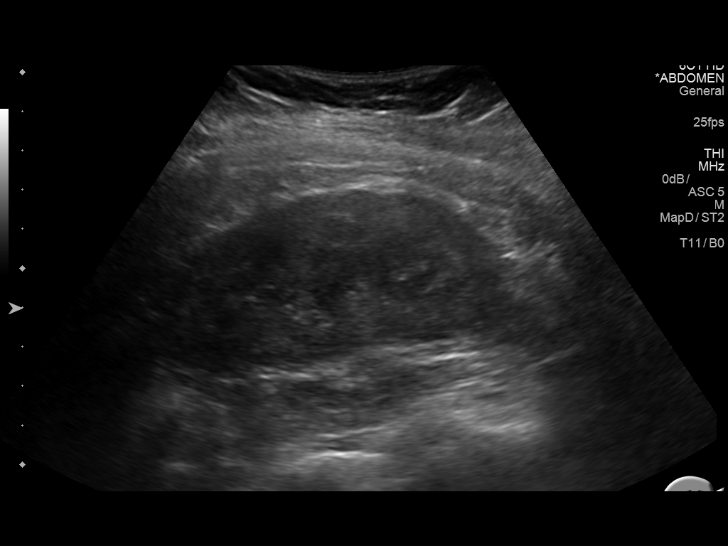
[im 66/80]
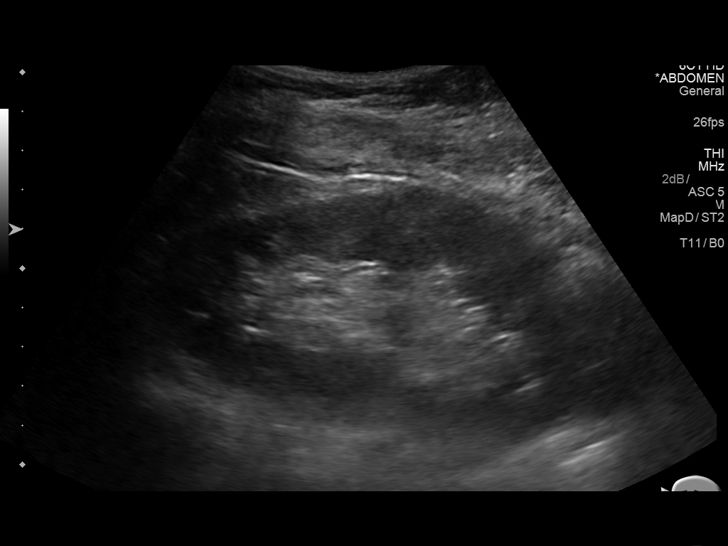
[im 73/80]
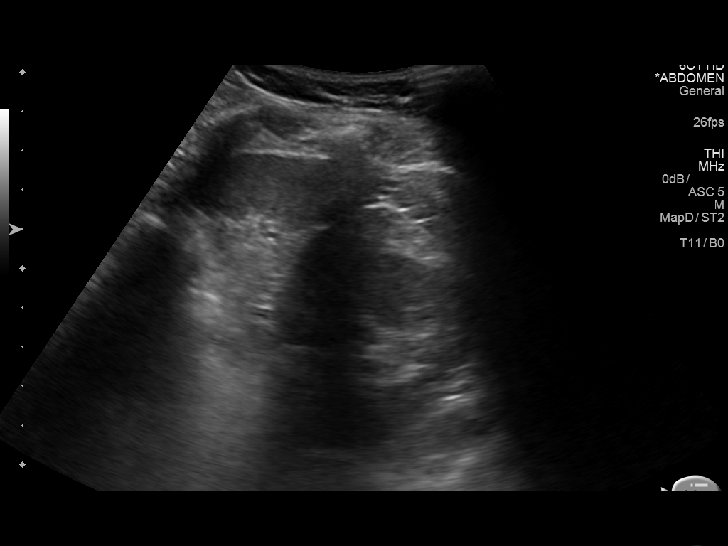
[im 80/80]
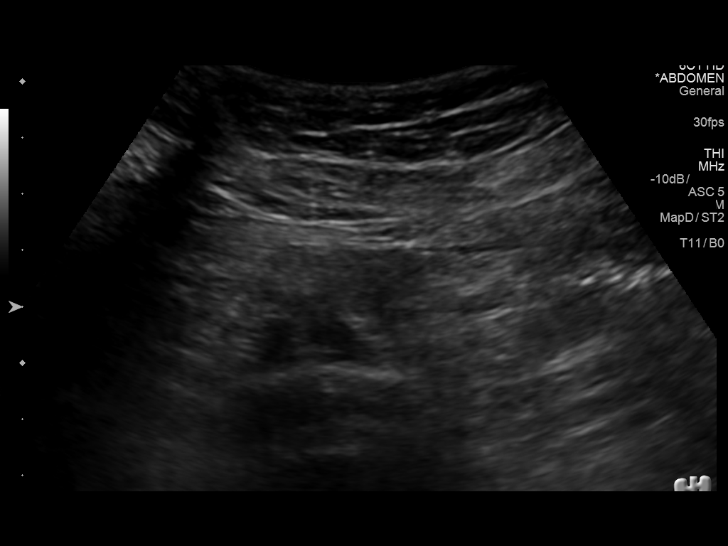

[13 of 25 positions shown; findings below may reference images not displayed]

FINDINGS: Gallbladder: Within the gallbladder, there are multiple echogenic
foci which move and shadow consistent with cholelithiasis. Largest
individual gallstone measures 1.1 cm in length. There is no
appreciable gallbladder wall thickening or pericholecystic fluid. No
sonographic Murphy sign noted by sonographer.

Common bile duct: Diameter: 4 mm. No intrahepatic, common hepatic,
or common bile duct dilatation.

Liver: No focal lesion identified. Within normal limits in
parenchymal echogenicity.

IVC: No abnormality visualized.

Pancreas: Visualized portion unremarkable. Portions of pancreas
obscured by gas.

Spleen: Size and appearance within normal limits.

Right Kidney: Length: 10.5 cm. Echogenicity within normal limits. No
mass or hydronephrosis visualized.

Left Kidney: Length: 11.1 cm. Echogenicity within normal limits. No
mass or hydronephrosis visualized.

Abdominal aorta: No aneurysm visualized.

Other findings: No demonstrable ascites.
IMPRESSION: Cholelithiasis.

Portions of pancreas obscured by gas. Visualized portions of
pancreas appear unremarkable.

Study otherwise unremarkable.

## 2017-12-01 IMAGING — US US TRANSVAGINAL NON-OB
1 series · 13 of 25 positions shown · non-contrast
Comparison: None

CLINICAL DATA: Postmenopausal bleeding intermittently for 3-4
months. Previous bilateral oophorectomy.

EXAM:
TRANSABDOMINAL AND TRANSVAGINAL ULTRASOUND OF PELVIS
TECHNIQUE: Both transabdominal and transvaginal ultrasound examinations of the
pelvis were performed. Transabdominal technique was performed for
global imaging of the pelvis including uterus, ovaries, adnexal
regions, and pelvic cul-de-sac. It was necessary to proceed with
endovaginal exam following the transabdominal exam to visualize the
endometrium and adnexa.

[Series 1: us transvaginal non-ob · 0.18mm/px · 13 of 54 slices shown]
[im 1/54]
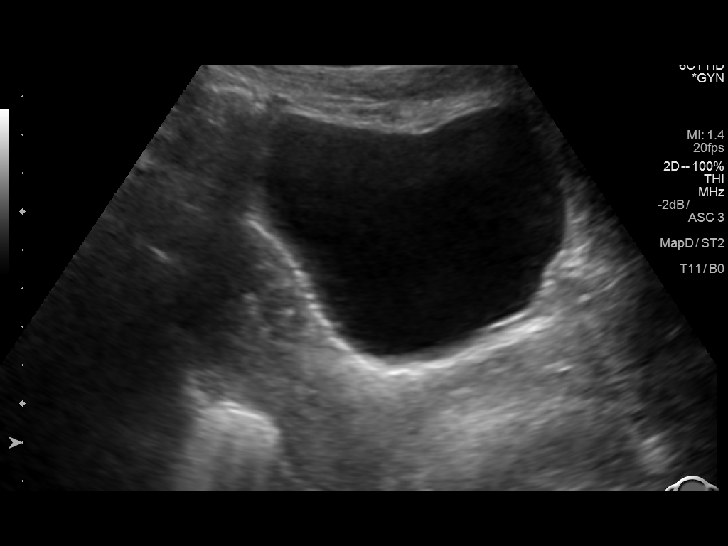
[im 5/54]
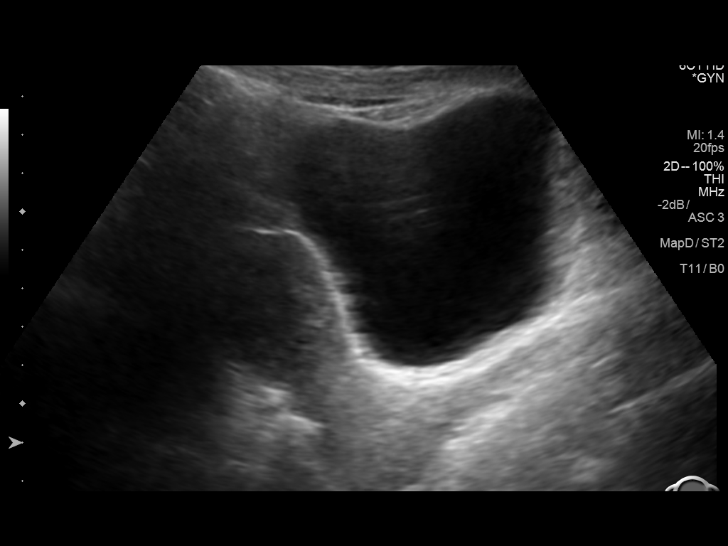
[im 9/54]
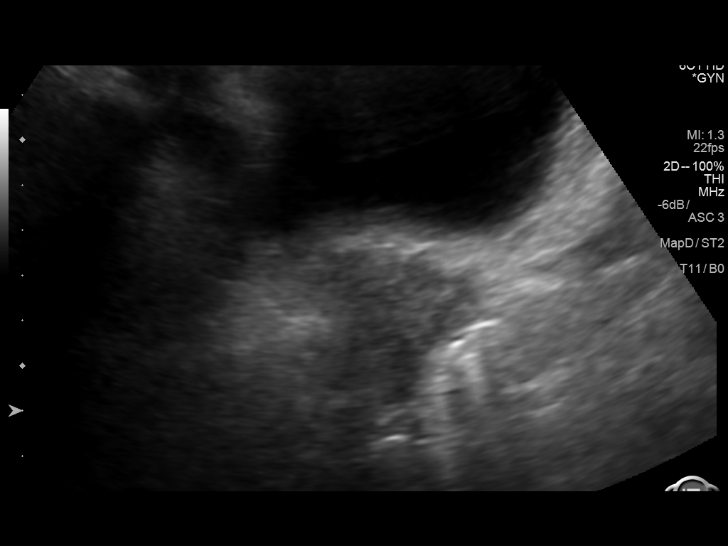
[im 14/54]
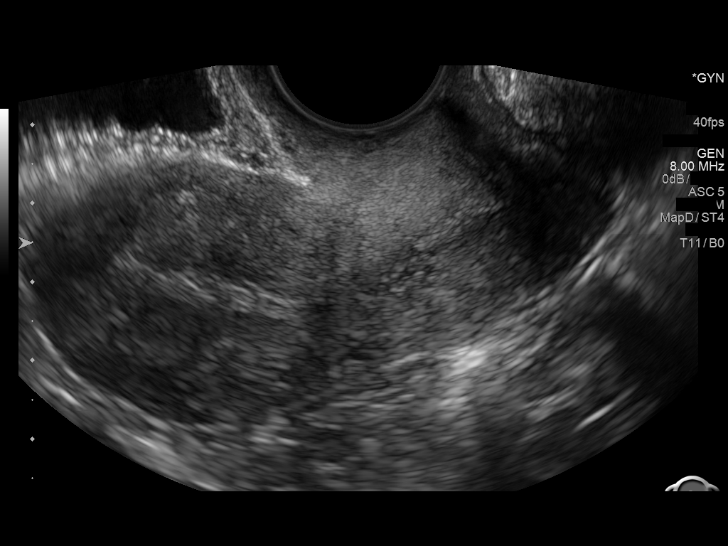
[im 18/54]
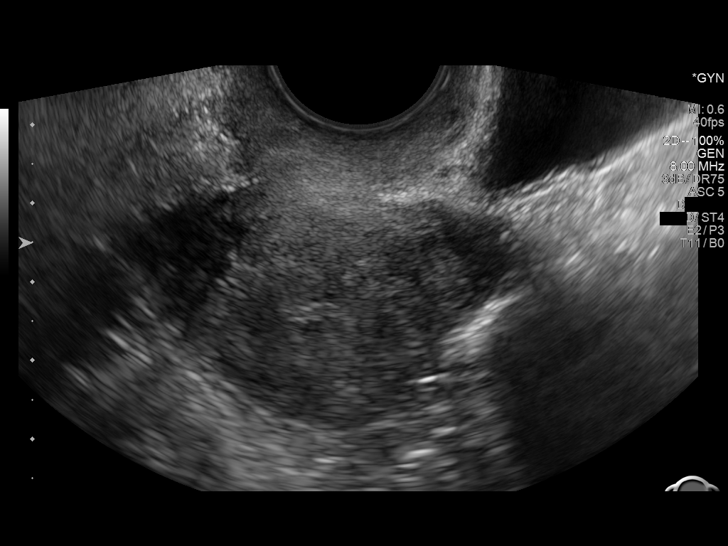
[im 23/54]
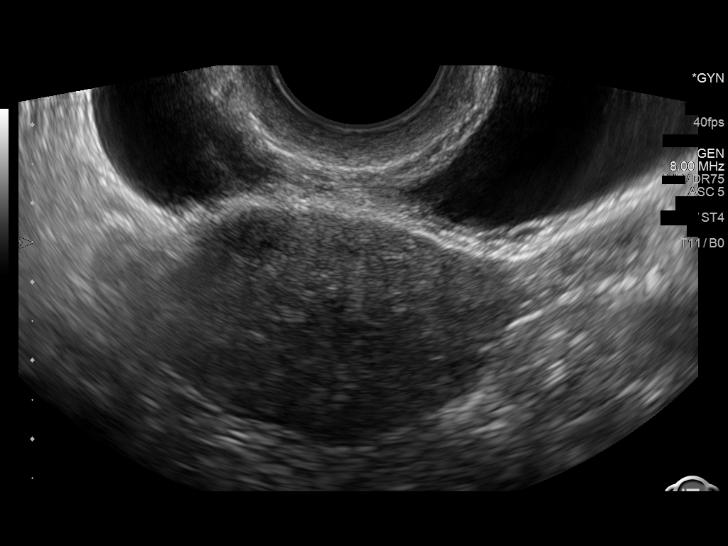
[im 27/54]
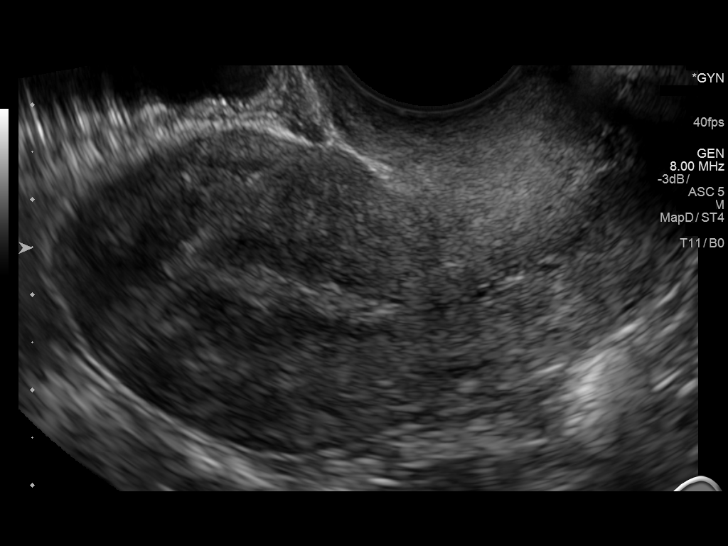
[im 31/54]
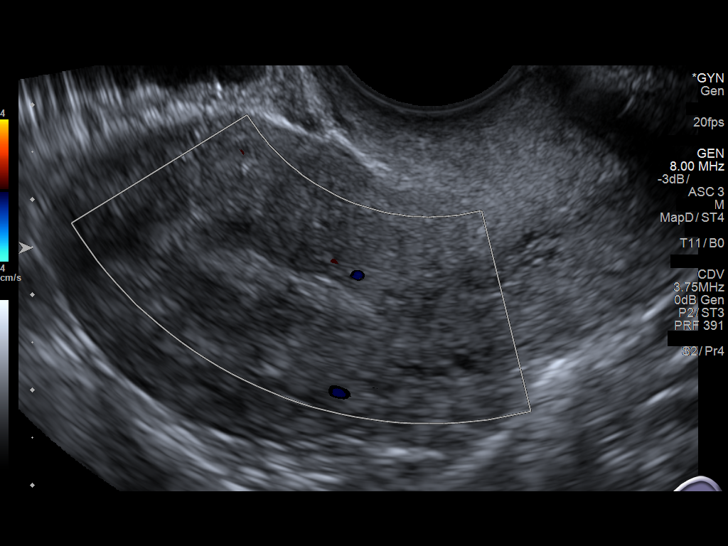
[im 36/54]
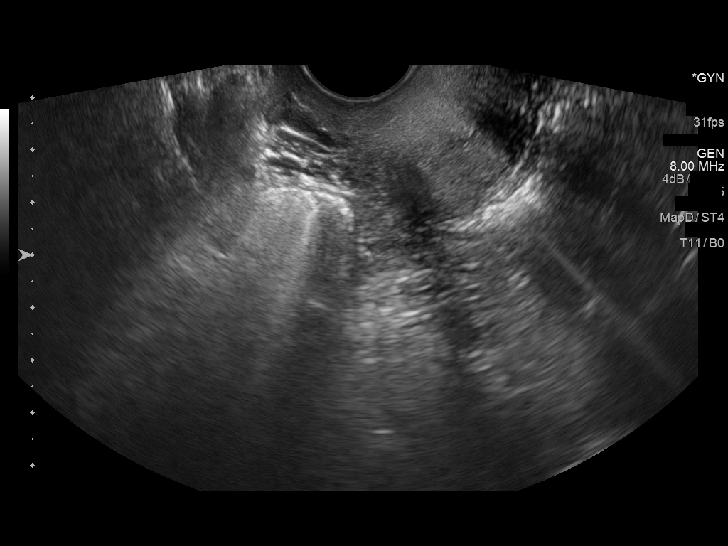
[im 40/54]
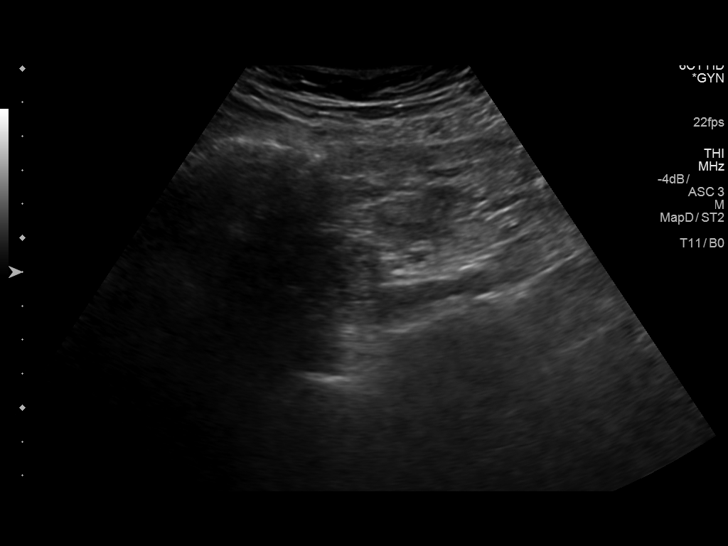
[im 45/54]
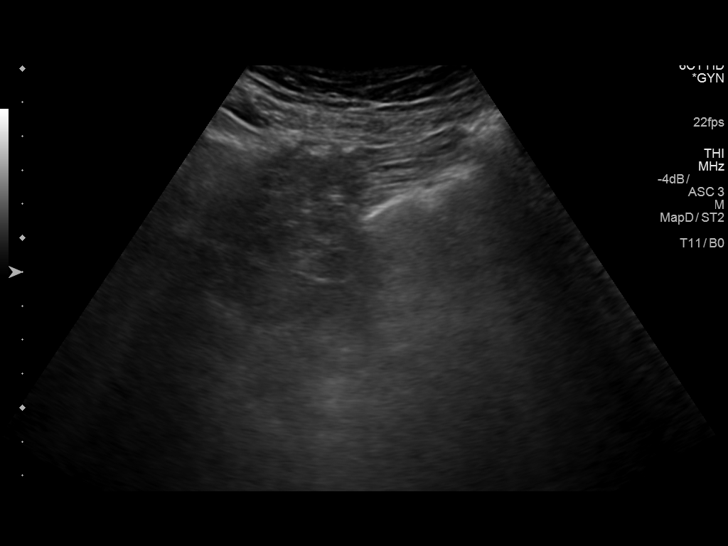
[im 49/54]
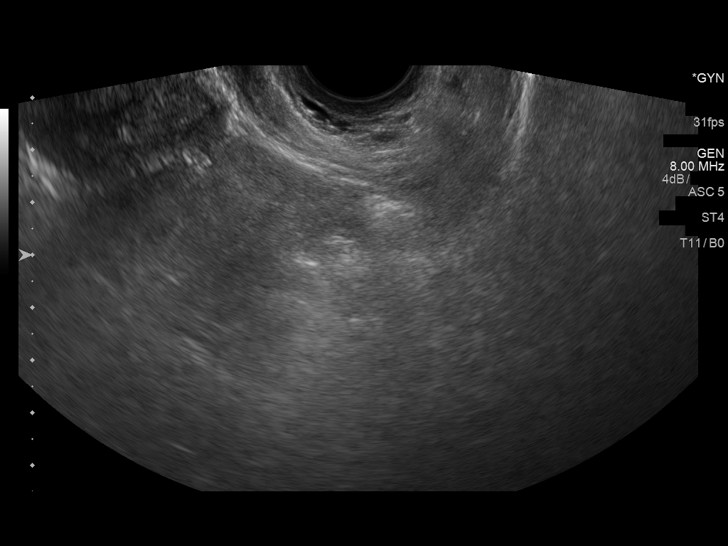
[im 54/54]
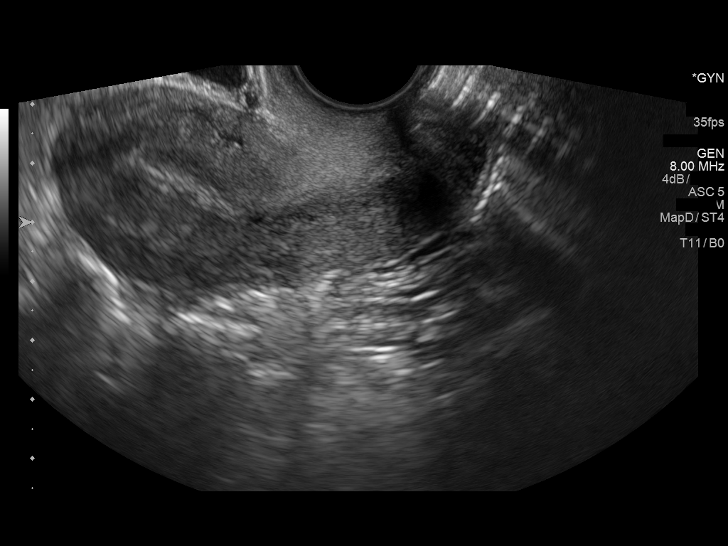

[13 of 25 positions shown; findings below may reference images not displayed]

FINDINGS: Uterus

Measurements: 7.1 x 3.4 x 3.9 cm. No fibroids or other mass
visualized.

Endometrium

Thickness: 6 mm.  No focal abnormality visualized.

Right ovary

Measurements: Surgically absent. No adnexal mass identified.

Left ovary

Measurements: Surgically absent. No adnexal mass identified.

Other findings

No abnormal free fluid.
IMPRESSION: No evidence of uterine fibroids. Endometrial thickness measures 6
mm. In the setting of post-menopausal bleeding, endometrial sampling
is indicated to exclude carcinoma. If results are benign,
sonohysterogram should be considered for focal lesion work-up. (Ref:
Radiological Reasoning: Algorithmic Workup of Abnormal Vaginal
Bleeding with Endovaginal Sonography and Sonohysterography. AJR
1117; 191:S68-73).

Previous bilateral oophorectomy.  No adnexal mass identified.

## 2018-10-21 ENCOUNTER — Encounter: Payer: Self-pay | Admitting: Physician Assistant

## 2018-10-21 ENCOUNTER — Telehealth: Payer: Self-pay | Admitting: Physician Assistant

## 2018-10-21 DIAGNOSIS — C50911 Malignant neoplasm of unspecified site of right female breast: Secondary | ICD-10-CM | POA: Insufficient documentation

## 2018-10-21 NOTE — Telephone Encounter (Signed)
Patient has moved to [place] removed as PCP.

## 2018-10-21 NOTE — Telephone Encounter (Signed)
I got a report about right breast cancer follow up but have not seen patient since 2018. I am assuming she is not my patient anymore. Can you confirm and if so take me off her as her PcP?
